# Patient Record
Sex: Male | Born: 1980 | Race: White | Hispanic: No | Marital: Single | Smoking: Former smoker
Health system: Southern US, Community
[De-identification: ages and names within clinical notes are randomized; demographics above are authoritative.]

## PROBLEM LIST (undated history)

## (undated) HISTORY — PX: SHOULDER ARTHROSCOPY W/ LABRAL REPAIR: SHX2399

---
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
## 2008-12-02 ENCOUNTER — Encounter: Admission: RE | Admit: 2008-12-02 | Discharge: 2008-12-02 | Payer: Self-pay | Admitting: Sports Medicine

## 2009-06-01 ENCOUNTER — Encounter: Admission: RE | Admit: 2009-06-01 | Discharge: 2009-06-01 | Payer: Self-pay | Admitting: Gastroenterology

## 2010-08-01 ENCOUNTER — Emergency Department (HOSPITAL_COMMUNITY): Payer: BC Managed Care – PPO

## 2010-08-01 ENCOUNTER — Emergency Department (HOSPITAL_COMMUNITY)
Admission: EM | Admit: 2010-08-01 | Discharge: 2010-08-01 | Disposition: A | Discharge status: Home / Self Care | Payer: BC Managed Care – PPO | Attending: Emergency Medicine | Admitting: Emergency Medicine

## 2010-08-01 DIAGNOSIS — M25539 Pain in unspecified wrist: Secondary | ICD-10-CM | POA: Insufficient documentation

## 2010-08-01 DIAGNOSIS — M25519 Pain in unspecified shoulder: Secondary | ICD-10-CM | POA: Insufficient documentation

## 2011-04-10 ENCOUNTER — Encounter: Payer: Self-pay | Admitting: *Deleted

## 2011-04-10 ENCOUNTER — Emergency Department (HOSPITAL_COMMUNITY)
Admission: EM | Admit: 2011-04-10 | Discharge: 2011-04-10 | Disposition: A | Discharge status: Home / Self Care | Payer: BC Managed Care – PPO | Attending: Emergency Medicine | Admitting: Emergency Medicine

## 2011-04-10 DIAGNOSIS — R109 Unspecified abdominal pain: Secondary | ICD-10-CM | POA: Insufficient documentation

## 2011-04-10 DIAGNOSIS — K2970 Gastritis, unspecified, without bleeding: Secondary | ICD-10-CM

## 2011-04-10 DIAGNOSIS — R112 Nausea with vomiting, unspecified: Secondary | ICD-10-CM | POA: Insufficient documentation

## 2011-04-10 LAB — COMPREHENSIVE METABOLIC PANEL
ALT: 15 U/L (ref 0–53)
Alkaline Phosphatase: 82 U/L (ref 39–117)
CO2: 27 mEq/L (ref 19–32)
Calcium: 10 mg/dL (ref 8.4–10.5)
Total Protein: 7.9 g/dL (ref 6.0–8.3)

## 2011-04-10 LAB — CBC
Hemoglobin: 17.2 g/dL — ABNORMAL HIGH (ref 13.0–17.0)
MCHC: 36.3 g/dL — ABNORMAL HIGH (ref 30.0–36.0)
RBC: 5.43 MIL/uL (ref 4.22–5.81)

## 2011-04-10 LAB — DIFFERENTIAL
Basophils Relative: 0 % (ref 0–1)
Eosinophils Absolute: 0.4 10*3/uL (ref 0.0–0.7)
Eosinophils Relative: 4 % (ref 0–5)
Lymphocytes Relative: 29 % (ref 12–46)
Lymphs Abs: 3.3 10*3/uL (ref 0.7–4.0)
Monocytes Absolute: 1.1 10*3/uL — ABNORMAL HIGH (ref 0.1–1.0)
Monocytes Relative: 9 % (ref 3–12)
Neutro Abs: 6.4 10*3/uL (ref 1.7–7.7)

## 2011-04-10 LAB — URINALYSIS, ROUTINE W REFLEX MICROSCOPIC
Nitrite: NEGATIVE
pH: 6 (ref 5.0–8.0)

## 2011-04-10 LAB — URINE MICROSCOPIC-ADD ON

## 2011-04-10 MED ORDER — ONDANSETRON HCL 4 MG/2ML IJ SOLN
4.0000 mg | Freq: Once | INTRAMUSCULAR | Status: AC
  Administered 2011-04-10: 4 mg via INTRAVENOUS
  Filled 2011-04-10: qty 2

## 2011-04-10 MED ORDER — METOCLOPRAMIDE HCL 10 MG PO TABS: 10.0000 mg | ORAL_TABLET | Freq: Four times a day (QID) | ORAL | Status: AC | PRN

## 2011-04-10 MED ORDER — SODIUM CHLORIDE 0.9 % IV BOLUS (SEPSIS)
1000.0000 mL | Freq: Once | INTRAVENOUS | Status: AC
  Administered 2011-04-10: 1000 mL via INTRAVENOUS

## 2011-04-10 MED ORDER — SODIUM CHLORIDE 0.9 % IV SOLN
Freq: Once | INTRAVENOUS | Status: AC
  Administered 2011-04-10: 1000 mL via INTRAVENOUS

## 2011-04-10 NOTE — ED Notes (Signed)
No pain or nausea reported by patient.  Resting comfortably.

## 2011-04-10 NOTE — ED Provider Notes (Signed)
History     CSN: [id number] Arrival date & time: 04/10/2011 11:26 AM   First MD Initiated Contact with Patient 04/10/11 1414      Chief Complaint  Patient presents with  . Abdominal Pain    (Consider location/radiation/quality/duration/timing/severity/associated sxs/prior treatment) Patient is a 30 y.o. male presenting with abdominal pain. The history is provided by the patient.  Abdominal Pain The primary symptoms of the illness include abdominal pain.  He started getting sick 4 days ago oh with nausea and vomiting. This was associated with some crampy periumbilical pain which was 8/10 at its worst. The vomiting subsided yesterday but he has ongoing nausea and has not been able to drink as much fluids as she would like. He denies any diarrhea. Today, abdominal pain is gone and current pain is 0/10. He denies fever, chills, sweats. He did not have any diarrhea. He went to an urgent care Center today who referred him to the emergency department. He denies any sick contacts. Symptoms are described as severe although they are improving.  History reviewed. No pertinent past medical history.  History reviewed. No pertinent past surgical history.  No family history on file.  History  Substance Use Topics  . Smoking status: Never Smoker   . Smokeless tobacco: Not on file  . Alcohol Use: Yes     ocassionally      Review of Systems  Gastrointestinal: Positive for abdominal pain.  All other systems reviewed and are negative.    Allergies  Review of patient's allergies indicates no known allergies.  Home Medications   Current Outpatient Rx  Name Route Sig Dispense Refill  . HYDROCODONE-ACETAMINOPHEN 5-500 MG PO TABS Oral Take 1 tablet by mouth every 6 (six) hours as needed.       BP 127/68  Pulse 78  Temp(Src) 98.7 F (37.1 C) (Oral)  Resp 18  Wt 177 lb (80.287 kg)  SpO2 97%  Physical Exam  Nursing note and vitals reviewed.  30 year old male who is resting  comfortably and in no acute distress. Vital signs are normal. Oxygen saturation is a satisfactory 97% on room air. Head is normocephalic and atraumatic. Pupils are equal and reactive extraocular movements are full. Mucous members are dry. Neck is supple without adenopathy. Lungs are clear without rales, wheezes, rhonchi. Back is nontender and there is no CVA tenderness. Heart has regular rate rhythm without murmur. No chest wall tenderness. Abdomen is soft, flat, with minimal periumbilical tenderness. There is no rebound or guarding. Peristalsis is diminished. Extremities have no cyanosis or edema, full range of motion is present. Skin is warm and moist without rash. Neurologic: Mental status is normal, cranial nerves are intact, there no focal motor or sensory deficits.  ED Course  Procedures (including critical care time)  Labs Reviewed - No data to display No results found. Results for orders placed during the hospital encounter of 04/10/11  CBC      Component Value Range   WBC 11.2 (*) 4.0 - 10.5 (K/uL)   RBC 5.43  4.22 - 5.81 (MIL/uL)   Hemoglobin 17.2 (*) 13.0 - 17.0 (g/dL)   HCT 74.2  59.5 - 63.8 (%)   MCV 87.3  78.0 - 100.0 (fL)   MCH 31.7  26.0 - 34.0 (pg)   MCHC 36.3 (*) 30.0 - 36.0 (g/dL)   RDW 75.6  43.3 - 29.5 (%)   Platelets 211  150 - 400 (K/uL)  DIFFERENTIAL      Component Value Range   Neutrophils  Relative 57  43 - 77 (%)   Neutro Abs 6.4  1.7 - 7.7 (K/uL)   Lymphocytes Relative 29  12 - 46 (%)   Lymphs Abs 3.3  0.7 - 4.0 (K/uL)   Monocytes Relative 9  3 - 12 (%)   Monocytes Absolute 1.1 (*) 0.1 - 1.0 (K/uL)   Eosinophils Relative 4  0 - 5 (%)   Eosinophils Absolute 0.4  0.0 - 0.7 (K/uL)   Basophils Relative 0  0 - 1 (%)   Basophils Absolute 0.1  0.0 - 0.1 (K/uL)  COMPREHENSIVE METABOLIC PANEL      Component Value Range   Sodium 134 (*) 135 - 145 (mEq/L)   Potassium 3.6  3.5 - 5.1 (mEq/L)   Chloride 95 (*) 96 - 112 (mEq/L)   CO2 27  19 - 32 (mEq/L)   Glucose, Bld  99  70 - 99 (mg/dL)   BUN 19  6 - 23 (mg/dL)   Creatinine, Ser 0.98  0.50 - 1.35 (mg/dL)   Calcium 11.9  8.4 - 10.5 (mg/dL)   Total Protein 7.9  6.0 - 8.3 (g/dL)   Albumin 4.8  3.5 - 5.2 (g/dL)   AST 16  0 - 37 (U/L)   ALT 15  0 - 53 (U/L)   Alkaline Phosphatase 82  39 - 117 (U/L)   Total Bilirubin 1.0  0.3 - 1.2 (mg/dL)   GFR calc non Af Amer 80 (*) >90 (mL/min)   GFR calc Af Amer >90  >90 (mL/min)  LIPASE, BLOOD      Component Value Range   Lipase 61 (*) 11 - 59 (U/L)  URINALYSIS, ROUTINE W REFLEX MICROSCOPIC      Component Value Range   Color, Urine AMBER (*) YELLOW    APPearance CLEAR  CLEAR    Specific Gravity, Urine 1.030  1.005 - 1.030    pH 6.0  5.0 - 8.0    Glucose, UA NEGATIVE  NEGATIVE (mg/dL)   Hgb urine dipstick NEGATIVE  NEGATIVE    Bilirubin Urine SMALL (*) NEGATIVE    Ketones, ur TRACE (*) NEGATIVE (mg/dL)   Protein, ur NEGATIVE  NEGATIVE (mg/dL)   Urobilinogen, UA 1.0  0.0 - 1.0 (mg/dL)   Nitrite NEGATIVE  NEGATIVE    Leukocytes, UA SMALL (*) NEGATIVE   URINE MICROSCOPIC-ADD ON      Component Value Range   Squamous Epithelial / LPF FEW (*) RARE    WBC, UA 7-10  <3 (WBC/hpf)   Bacteria, UA FEW (*) RARE    No results found.    No diagnosis found.  He is feeling much better after IV hydration and IV Zofran. Slight elevation of lipase probably not significant. I've talked to the patient and he is adamant that he has not done any significant drinking in the last 2 years.  MDM  Probable episode of viral gastritis.        Dione Booze, MD 04/10/11 [phone]

## 2011-04-10 NOTE — ED Notes (Signed)
Report received from Mildred RN. 
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
## 2012-10-01 ENCOUNTER — Ambulatory Visit: Payer: Self-pay | Admitting: Family Medicine

## 2012-10-01 VITALS — BP 130/84 | HR 70 | Temp 98.5°F | Resp 16 | Ht 70.5 in | Wt 187.0 lb

## 2012-10-01 DIAGNOSIS — Z0289 Encounter for other administrative examinations: Secondary | ICD-10-CM

## 2012-10-01 DIAGNOSIS — Z0000 Encounter for general adult medical examination without abnormal findings: Secondary | ICD-10-CM

## 2012-10-01 NOTE — Progress Notes (Signed)
[facility] [address] [place] [address] [phone]  Date:  10/01/2012   Name:  Julian Jensen   DOB:  1980-06-07   MRN:  [id number]  PCP:  No primary provider on file.    Chief Complaint: Employment Physical   History of Present Illness:  Julian Jensen is a 32 y.o. very pleasant male patient who presents with the following:  Here for a self- pay DOT.  He is generally healthy. He does not have any significant past medical history that he is aware of.  He has been told that he had hematuria in the past- this has been the case for a long time, but he has not had any further evaluation so far.   There are no active problems to display for this patient.   History reviewed. No pertinent past medical history.  History reviewed. No pertinent past surgical history.  History  Substance Use Topics  . Smoking status: Never Smoker   . Smokeless tobacco: Not on file  . Alcohol Use: Yes     Comment: ocassionally    History reviewed. No pertinent family history.  No Known Allergies  Medication list has been reviewed and updated.  Current Outpatient Prescriptions on File Prior to Visit  Medication Sig Dispense Refill  . HYDROcodone-acetaminophen (VICODIN) 5-500 MG per tablet Take 1 tablet by mouth every 6 (six) hours as needed.        No current facility-administered medications on file prior to visit.    Review of Systems:  As per HPI- otherwise negative.   Physical Examination: Filed Vitals:   10/01/12 1442  BP: 130/84  Pulse: 70  Temp: 98.5 F (36.9 C)  Resp: 16   Filed Vitals:   10/01/12 1442  Height: 5' 10.5" (1.791 m)  Weight: 187 lb (84.823 kg)   Body mass index is 26.44 kg/(m^2). Ideal Body Weight: Weight in (lb) to have BMI = 25: 176.4  GEN: WDWN, NAD, Non-toxic, A & O x 3 HEENT: Atraumatic, Normocephalic. Neck supple. No masses, No LAD.  Bilateral TM wnl, oropharynx normal.  PEERL,EOMI.   Ears and Nose: No external  deformity. CV: RRR, No M/G/R. No JVD. No thrill. No extra heart sounds. PULM: CTA B, no wheezes, crackles, rhonchi. No retractions. No resp. distress. No accessory muscle use. ABD: S, NT, ND, +BS. No rebound. No HSM. EXTR: No c/c/e.  Normal strength, sensation and DTR NEURO Normal gait.  PSYCH: Normally interactive. Conversant. Not depressed or anxious appearing.  Calm demeanor.  No inguinal hernia   Assessment and Plan: Physical exam  DOT PE- normal, 2 year card given.   Advised him to have his persistent microhematuria evaluated- he will plan to call [facility] and schedule an appt. If he needs a referral we are glad to help him with this.   Signed Abbe Amsterdam, MD

## 2012-11-08 ENCOUNTER — Encounter: Payer: Self-pay | Admitting: Family Medicine

## 2013-01-21 ENCOUNTER — Ambulatory Visit: Payer: PRIVATE HEALTH INSURANCE | Admitting: Family Medicine

## 2013-01-21 VITALS — BP 132/80 | HR 76 | Temp 98.1°F | Resp 16 | Ht 70.0 in | Wt 182.6 lb

## 2013-01-21 DIAGNOSIS — Z113 Encounter for screening for infections with a predominantly sexual mode of transmission: Secondary | ICD-10-CM

## 2013-01-21 DIAGNOSIS — Z202 Contact with and (suspected) exposure to infections with a predominantly sexual mode of transmission: Secondary | ICD-10-CM

## 2013-01-21 MED ORDER — AZITHROMYCIN 250 MG PO TABS: ORAL_TABLET | ORAL | Status: DC

## 2013-01-21 NOTE — Progress Notes (Signed)
  [facility]:  Office Visit  Chief Complaint:  Chief Complaint  Patient presents with  . Exposure to STD    HPI: Julian Jensen is a 32 y.o. male who is here for check of STD, GF of 8 months has chlamydia.  She has been treated and he has not had sex with her since then.  He denies any sxs or any prior STDs  History reviewed. No pertinent past medical history. History reviewed. No pertinent past surgical history. History   Social History  . Marital Status: Single    Spouse Name: N/A    Number of Children: N/A  . Years of Education: N/A   Social History Main Topics  . Smoking status: Never Smoker   . Smokeless tobacco: None  . Alcohol Use: Yes     Comment: ocassionally  . Drug Use: No  . Sexual Activity: Yes   Other Topics Concern  . None   Social History Narrative  . None   History reviewed. No pertinent family history. No Known Allergies Prior to Admission medications   Medication Sig Start Date End Date Taking? Authorizing Provider  HYDROcodone-acetaminophen (VICODIN) 5-500 MG per tablet Take 1 tablet by mouth every 6 (six) hours as needed.     Historical Provider, MD     ROS: The patient denies fevers, chills, night sweats, unintentional weight loss, chest pain, palpitations, wheezing, dyspnea on exertion, nausea, vomiting, abdominal pain, dysuria, hematuria, melena, numbness, weakness, or tingling.   All other systems have been reviewed and were otherwise negative with the exception of those mentioned in the HPI and as above.    PHYSICAL EXAM: Filed Vitals:   01/21/13 1437  BP: 132/80  Pulse: 76  Temp: 98.1 F (36.7 C)  Resp: 16   Filed Vitals:   01/21/13 1437  Height: 5\' 10"  (1.778 m)  Weight: 182 lb 9.6 oz (82.827 kg)   Body mass index is 26.2 kg/(m^2).  General: Alert, no acute distress HEENT:  Normocephalic, atraumatic, oropharynx patent. EOMI, PERRLA Cardiovascular:  Regular rate and rhythm, no rubs murmurs or gallops.   No Carotid bruits, radial pulse intact. No pedal edema.  Respiratory: Clear to auscultation bilaterally.  No wheezes, rales, or rhonchi.  No cyanosis, no use of accessory musculature GI: No organomegaly, abdomen is soft and non-tender, positive bowel sounds.  No masses. Skin: No rashes. Neurologic: Facial musculature symmetric. Psychiatric: Patient is appropriate throughout our interaction. Lymphatic: No cervical lymphadenopathy Musculoskeletal: Gait intact.   LABS:    EKG/XRAY:   Primary read interpreted by Dr. Conley Rolls at [facility].   ASSESSMENT/PLAN: Encounter Diagnoses  Name Primary?  . Possible exposure to STD Yes  . Screening for STD (sexually transmitted disease)    Rx Azithromycin 250 mg x 4 pills Labs pending: HIV, RPR, G/C He declined having other tests done Gross sideeffects, risk and benefits, and alternatives of medications d/w patient. Patient is aware that all medications have potential sideeffects and we are unable to predict every sideeffect or drug-drug interaction that may occur.  Hamilton Capri PHUONG, DO 01/21/2013 3:40 PM

## 2013-01-22 LAB — SYPHILIS: RPR W/REFLEX TO RPR TITER AND TREPONEMAL ANTIBODIES, TRADITIONAL SCREENING AND DIAGNOSIS ALGORITHM

## 2013-01-22 LAB — GC/CHLAMYDIA PROBE AMP, URINE
Chlamydia, Swab/Urine, PCR: POSITIVE — AB
GC Probe Amp, Urine: NEGATIVE

## 2013-01-22 LAB — HIV ANTIBODY (ROUTINE TESTING W REFLEX): HIV: NONREACTIVE

## 2013-02-26 ENCOUNTER — Other Ambulatory Visit: Payer: Self-pay

## 2013-03-23 ENCOUNTER — Ambulatory Visit (INDEPENDENT_AMBULATORY_CARE_PROVIDER_SITE_OTHER): Payer: PRIVATE HEALTH INSURANCE | Admitting: Family Medicine

## 2013-03-23 ENCOUNTER — Ambulatory Visit: Payer: PRIVATE HEALTH INSURANCE

## 2013-03-23 VITALS — BP 102/66 | HR 85 | Temp 98.0°F | Resp 16 | Ht 70.0 in | Wt 187.0 lb

## 2013-03-23 DIAGNOSIS — S43004A Unspecified dislocation of right shoulder joint, initial encounter: Secondary | ICD-10-CM

## 2013-03-23 DIAGNOSIS — S43006A Unspecified dislocation of unspecified shoulder joint, initial encounter: Secondary | ICD-10-CM

## 2013-03-23 MED ORDER — HYDROCODONE-ACETAMINOPHEN 5-500 MG PO TABS: 1.0000 | ORAL_TABLET | Freq: Four times a day (QID) | ORAL | Status: DC | PRN

## 2013-03-23 NOTE — Patient Instructions (Signed)
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
## 2015-03-15 IMAGING — CR DG SHOULDER 2+V*R*
3 series · 3 of 3 positions shown · non-contrast
Comparison: MR arthrogram dated 12/02/2008.

CLINICAL DATA: Right shoulder pain and limited range of motion and
swelling. History of prior dislocations.

EXAM:
RIGHT SHOULDER - 2+ VIEW

[AP (1 of 2)]
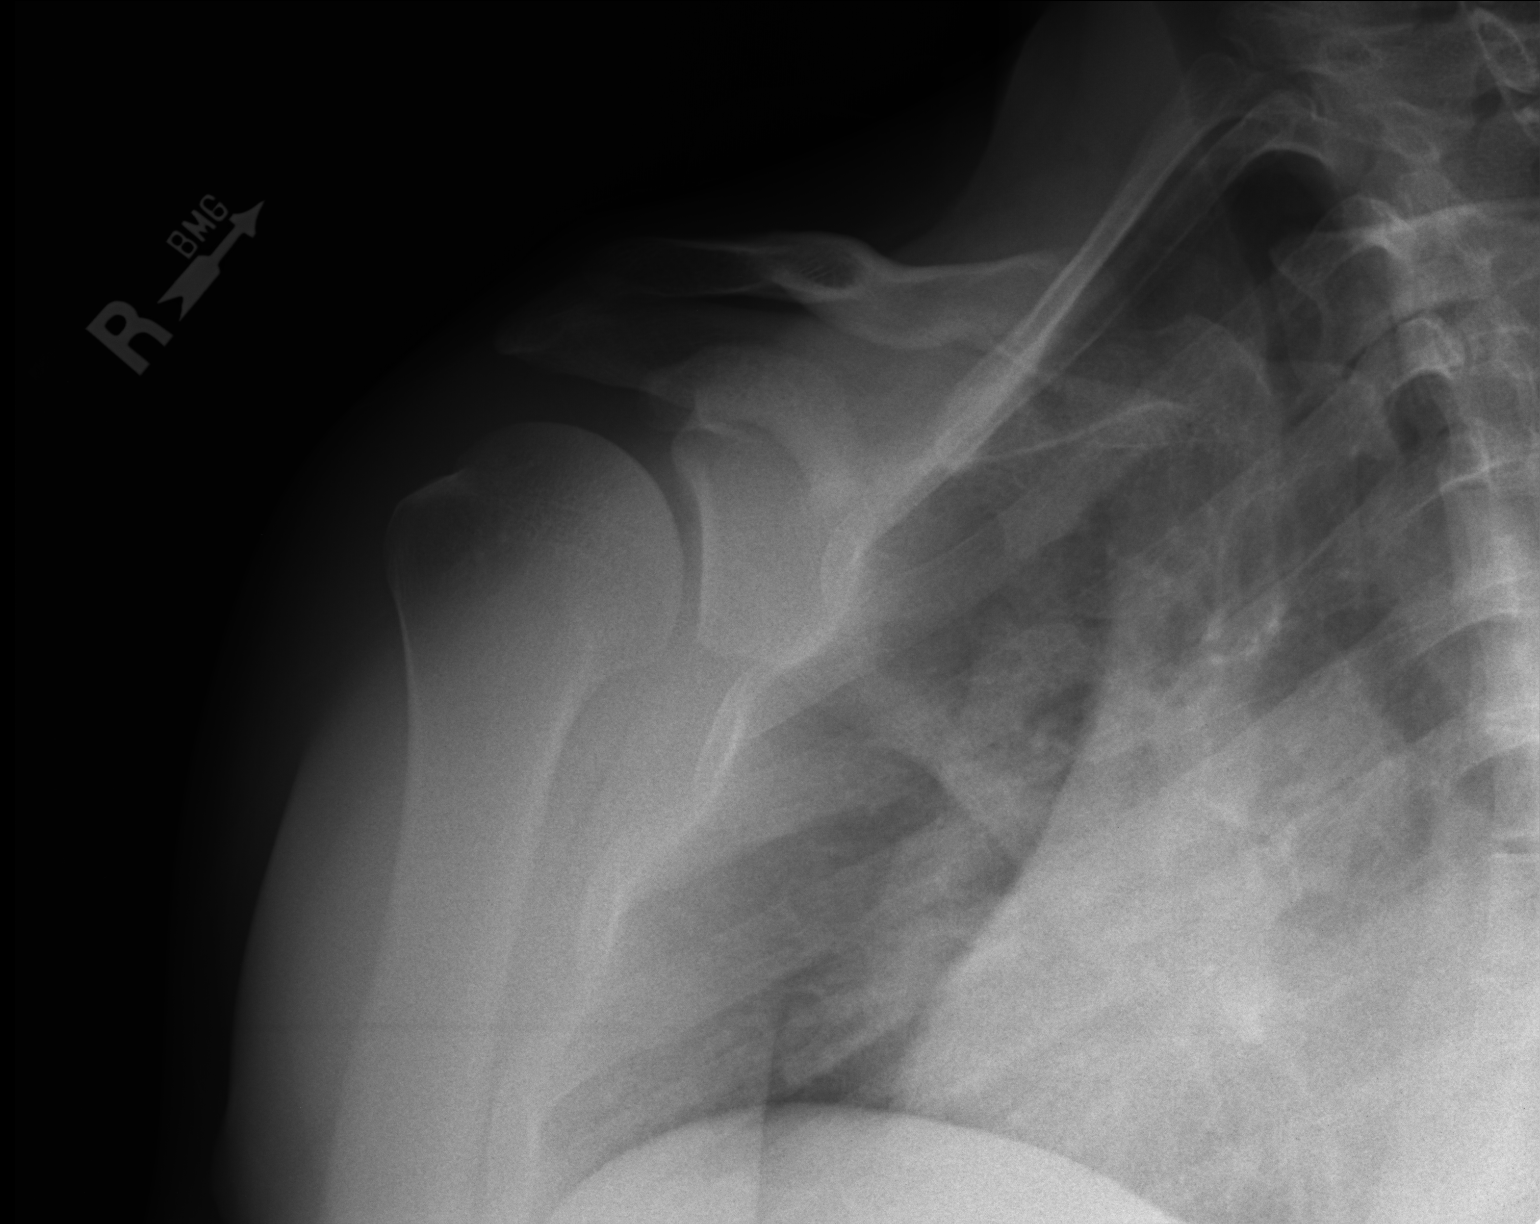

[AP (2 of 2)]
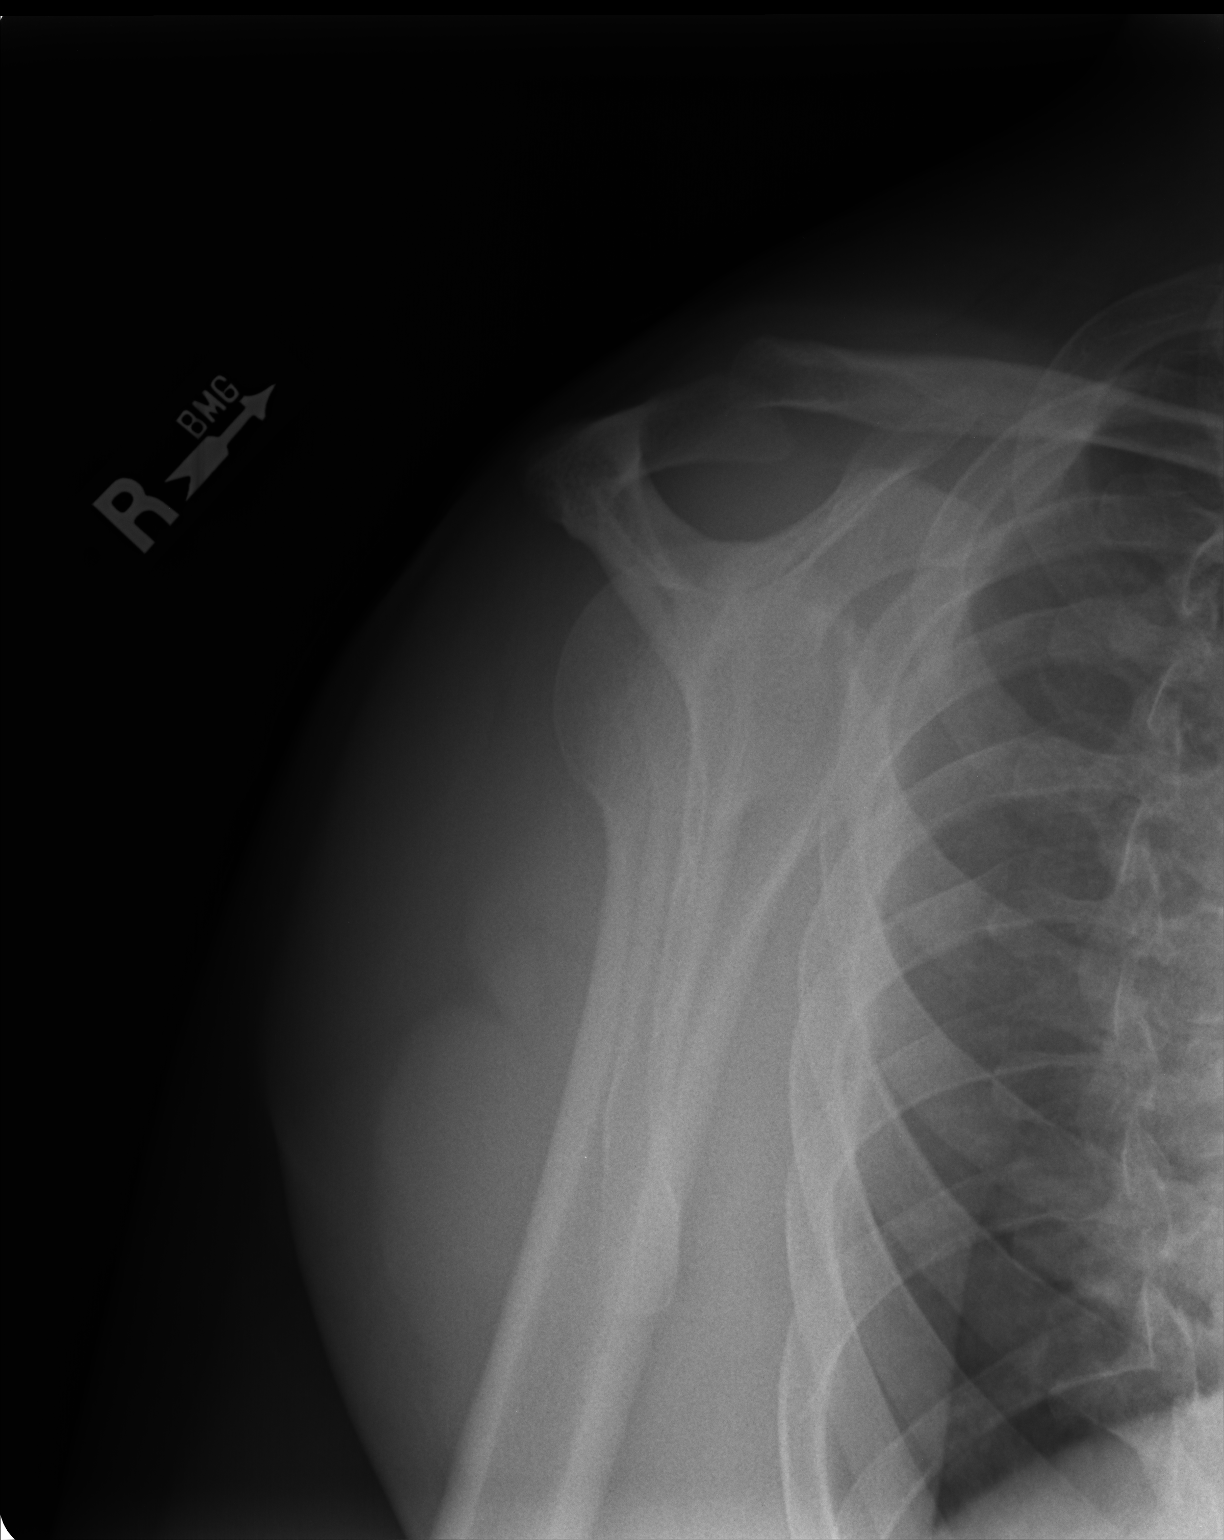

[axial]
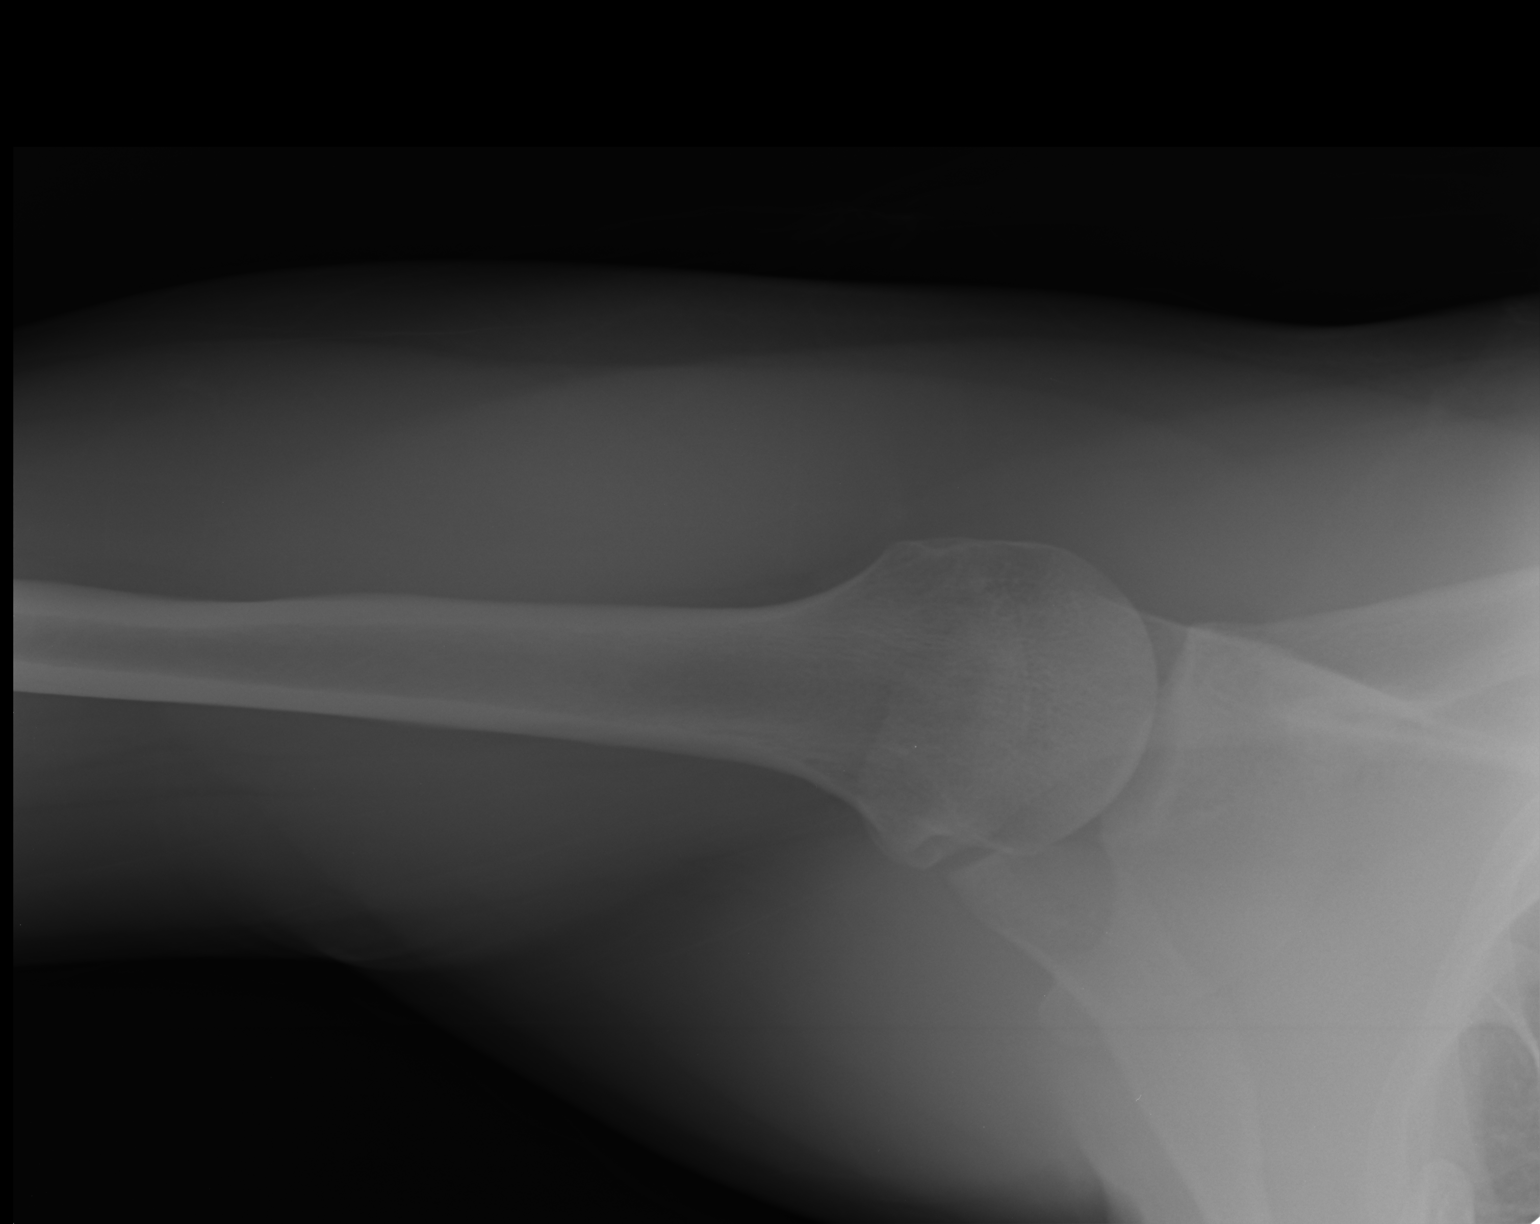

[3 of 3 positions shown; findings below may reference images not displayed]

FINDINGS: There is no acute fracture or dislocation. No soft tissue
calcifications. Acromioclavicular joint is normal.
IMPRESSION: No acute abnormalities.

## 2015-04-19 ENCOUNTER — Emergency Department (HOSPITAL_BASED_OUTPATIENT_CLINIC_OR_DEPARTMENT_OTHER): Payer: BLUE CROSS/BLUE SHIELD

## 2015-04-19 ENCOUNTER — Encounter (HOSPITAL_BASED_OUTPATIENT_CLINIC_OR_DEPARTMENT_OTHER): Payer: Self-pay | Admitting: *Deleted

## 2015-04-19 ENCOUNTER — Emergency Department (HOSPITAL_BASED_OUTPATIENT_CLINIC_OR_DEPARTMENT_OTHER)
Admission: EM | Admit: 2015-04-19 | Discharge: 2015-04-19 | Disposition: A | Discharge status: Home / Self Care | Payer: BLUE CROSS/BLUE SHIELD | Attending: Emergency Medicine | Admitting: Emergency Medicine

## 2015-04-19 DIAGNOSIS — S43014A Anterior dislocation of right humerus, initial encounter: Secondary | ICD-10-CM | POA: Insufficient documentation

## 2015-04-19 DIAGNOSIS — F17210 Nicotine dependence, cigarettes, uncomplicated: Secondary | ICD-10-CM | POA: Diagnosis not present

## 2015-04-19 DIAGNOSIS — S4991XA Unspecified injury of right shoulder and upper arm, initial encounter: Secondary | ICD-10-CM | POA: Diagnosis present

## 2015-04-19 DIAGNOSIS — Y998 Other external cause status: Secondary | ICD-10-CM | POA: Insufficient documentation

## 2015-04-19 DIAGNOSIS — Y9289 Other specified places as the place of occurrence of the external cause: Secondary | ICD-10-CM | POA: Diagnosis not present

## 2015-04-19 DIAGNOSIS — S43004A Unspecified dislocation of right shoulder joint, initial encounter: Secondary | ICD-10-CM

## 2015-04-19 DIAGNOSIS — Y9389 Activity, other specified: Secondary | ICD-10-CM | POA: Insufficient documentation

## 2015-04-19 DIAGNOSIS — X501XXA Overexertion from prolonged static or awkward postures, initial encounter: Secondary | ICD-10-CM | POA: Diagnosis not present

## 2015-04-19 MED ORDER — HYDROMORPHONE HCL 1 MG/ML IJ SOLN
INTRAMUSCULAR | Status: AC
  Administered 2015-04-19: 1 mg via INTRAVENOUS
  Filled 2015-04-19: qty 1

## 2015-04-19 MED ORDER — KETOROLAC TROMETHAMINE 15 MG/ML IJ SOLN
INTRAMUSCULAR | Status: AC
  Administered 2015-04-19: 15 mg via INTRAVENOUS
  Filled 2015-04-19: qty 1

## 2015-04-19 MED ORDER — KETOROLAC TROMETHAMINE 15 MG/ML IJ SOLN
15.0000 mg | Freq: Once | INTRAMUSCULAR | Status: AC
  Administered 2015-04-19: 15 mg via INTRAVENOUS

## 2015-04-19 MED ORDER — ETOMIDATE 2 MG/ML IV SOLN
10.0000 mg | Freq: Once | INTRAVENOUS | Status: AC
  Administered 2015-04-19: 10 mg via INTRAVENOUS
  Filled 2015-04-19: qty 10

## 2015-04-19 MED ORDER — ETOMIDATE 2 MG/ML IV SOLN
INTRAVENOUS | Status: AC | PRN
Start: 2015-04-19 — End: 2015-04-19
  Administered 2015-04-19: 6 mg via INTRAVENOUS

## 2015-04-19 MED ORDER — DIAZEPAM 5 MG/ML IJ SOLN
5.0000 mg | Freq: Once | INTRAMUSCULAR | Status: AC
  Administered 2015-04-19: 5 mg via INTRAVENOUS

## 2015-04-19 MED ORDER — LIDOCAINE HCL (PF) 1 % IJ SOLN
INTRAMUSCULAR | Status: AC
  Filled 2015-04-19: qty 10

## 2015-04-19 MED ORDER — TRAMADOL HCL 50 MG PO TABS: 50.0000 mg | ORAL_TABLET | Freq: Four times a day (QID) | ORAL | Status: AC | PRN

## 2015-04-19 MED ORDER — DIAZEPAM 5 MG/ML IJ SOLN
INTRAMUSCULAR | Status: AC
  Administered 2015-04-19: 5 mg via INTRAVENOUS
  Filled 2015-04-19: qty 2

## 2015-04-19 MED ORDER — SODIUM CHLORIDE 0.9 % IV SOLN
INTRAVENOUS | Status: DC
  Administered 2015-04-19: 15:00:00 via INTRAVENOUS

## 2015-04-19 MED ORDER — HYDROMORPHONE HCL 1 MG/ML IJ SOLN
1.0000 mg | Freq: Once | INTRAMUSCULAR | Status: AC
Start: 2015-04-19 — End: 2015-04-19
  Administered 2015-04-19: 1 mg via INTRAVENOUS

## 2015-04-19 NOTE — ED Notes (Signed)
MD at bedside attempting to reduce right shoulder.

## 2015-04-19 NOTE — ED Notes (Signed)
Consent signed for sedation for right shoulder reduction.

## 2015-04-19 NOTE — ED Notes (Signed)
Right shoulder appears to be reduced at this time.

## 2015-04-19 NOTE — Discharge Instructions (Signed)
Shoulder Dislocation °A shoulder dislocation happens when the upper arm bone (humerus) moves out of the shoulder joint. The shoulder joint is the part of the shoulder where the humerus, shoulder blade (scapula), and collarbone (clavicle) meet. °CAUSES °This condition is often caused by: °· A fall. °· A hit to the shoulder. °· A forceful movement of the shoulder. °RISK FACTORS °This condition is more likely to develop in people who play sports. °SYMPTOMS °Symptoms of this condition include: °· Deformity of the shoulder. °· Intense pain. °· Inability to move the shoulder. °· Numbness, weakness, or tingling in your neck or down your arm. °· Bruising or swelling around your shoulder. °DIAGNOSIS °This condition is diagnosed with a physical exam. After the exam, tests may be done to check for related problems. Tests that may be done include: °· X-ray. This may be done to check for broken bones. °· MRI. This may be done to check for damage to the tissues around the shoulder. °· Electromyogram. This may be done to check for nerve damage. °TREATMENT °This condition is treated with a procedure to place the humerus back in the joint. This procedure is called a reduction. There are two types of reduction: °· Closed reduction. In this procedure, the humerus is placed back in the joint without surgery. The health care provider uses his or her hands to guide the bone back into place. °· Open reduction. In this procedure, the humerus is placed back in the joint with surgery. An open reduction may be recommended if: °¨ You have a weak shoulder joint or weak ligaments. °¨ You have had more than one shoulder dislocation. °¨ The nerves or blood vessels around your shoulder have been damaged. °After the humerus is placed back into the joint, your arm will be placed in a splint or sling to prevent it from moving. You will need to wear the splint or sling until your shoulder heals. When the splint or sling is removed, you may have  physical therapy to help improve the range of motion in your shoulder joint. °HOME CARE INSTRUCTIONS °If You Have a Splint or Sling: °· Wear it as told by your health care provider. Remove it only as told by your health care provider. °· Loosen it if your fingers become numb and tingle, or if they turn cold and blue. °· Keep it clean and dry. °Bathing °· Do not take baths, swim, or use a hot tub until your health care provider approves. Ask your health care provider if you can take showers. You may only be allowed to take sponge baths for bathing. °· If your health care provider approves bathing and showering, cover your splint or sling with a watertight plastic bag to protect it from water. Do not let the splint or sling get wet. °Managing Pain, Stiffness, and Swelling °· If directed, apply ice to the injured area. °¨ Put ice in a plastic bag. °¨ Place a towel between your skin and the bag. °¨ Leave the ice on for 20 minutes, 2-3 times per day. °· Move your fingers often to avoid stiffness and to decrease swelling. °· Raise (elevate) the injured area above the level of your heart while you are sitting or lying down. °Driving °· Do not drive while wearing a splint or sling on a hand that you use for driving. °· Do not drive or operate heavy machinery while taking pain medicine. °Activity °· Return to your normal activities as told by your health care provider. Ask your   health care provider what activities are safe for you. °· Perform range-of-motion exercises only as told by your health care provider. °· Exercise your hand by squeezing a soft ball. This helps to decrease stiffness and swelling in your hand and wrist. °General Instructions °· Take over-the-counter and prescription medicines only as told by your health care provider. °· Do not use any tobacco products, including cigarettes, chewing tobacco, or e-cigarettes. Tobacco can delay bone and tissue healing. If you need help quitting, ask your health care  provider. °· Keep all follow-up visits as told by your health care provider. This is important. °SEEK MEDICAL CARE IF: °· Your splint or sling gets damaged. °SEEK IMMEDIATE MEDICAL CARE IF: °· Your pain gets worse rather than better. °· You lose feeling in your arm or hand. °· Your arm or hand becomes white and cold. °  °This information is not intended to replace advice given to you by your health care provider. Make sure you discuss any questions you have with your health care provider. °  °Document Released: 01/02/2001 Document Revised: 12/29/2014 Document Reviewed: 08/02/2014 °Elsevier Interactive Patient Education ©2016 [employer] ° °

## 2015-04-19 NOTE — ED Notes (Signed)
Pt c/o right shoulder  injury x 30 mins ago

## 2015-04-19 NOTE — ED Provider Notes (Signed)
CSN: [id number]     Arrival date & time 04/19/15  1424 History   First MD Initiated Contact with Patient 04/19/15 1452     Chief Complaint  Patient presents with  . Shoulder Injury     (Consider location/radiation/quality/duration/timing/severity/associated sxs/prior Treatment) HPI   34 year old male with right shoulder pain. Patient has a past history recurrent right shoulder dislocations. He has previously had surgery on the shoulder for the same. Just prior to arrival he was lifting up some sheet metal above his head when he felt his shoulder move and had acute onset of pain. It has been persistent since then. Symptoms were consistent with prior dislocations. No numbness or tingling. No other acute complaints. No intervention prior to arrival.  History reviewed. No pertinent past medical history. History reviewed. No pertinent past surgical history. History reviewed. No pertinent family history. Social History  Substance Use Topics  . Smoking status: Current Every Day Smoker -- 0.50 packs/day    Types: Cigarettes  . Smokeless tobacco: None  . Alcohol Use: Yes     Comment: ocassionally    Review of Systems  All systems reviewed and negative, other than as noted in HPI.   Allergies  Review of patient's allergies indicates no known allergies.  Home Medications   Prior to Admission medications   Not on File   BP 133/47 mmHg  Pulse 72  Temp(Src) 97.8 F (36.6 C) (Oral)  Resp 17  Ht 6' (1.829 m)  Wt 185 lb (83.915 kg)  BMI 25.08 kg/m2  SpO2 98% Physical Exam  Constitutional: He appears well-developed and well-nourished. No distress.  HENT:  Head: Normocephalic and atraumatic.  Eyes: Conjunctivae are normal. Right eye exhibits no discharge. Left eye exhibits no discharge.  Neck: Neck supple.  Cardiovascular: Normal rate, regular rhythm and normal heart sounds.  Exam reveals no gallop and no friction rub.   No murmur heard. Pulmonary/Chest: Effort normal and breath  sounds normal. No respiratory distress.  Abdominal: Soft. He exhibits no distension. There is no tenderness.  Musculoskeletal: He exhibits no edema or tenderness.  "squared off" appearance to right shoulder consistent with anterior dislocation. Significant pain with any attempted range of motion. Neurovascular intact.  Neurological: He is alert.  Skin: Skin is warm and dry.  Psychiatric: He has a normal mood and affect. His behavior is normal. Thought content normal.  Nursing note and vitals reviewed.   ED Course  Reduction of dislocation Date/Time: 05/20/2015 4:00 PM Performed by: Raeford Razor Authorized by: Raeford Razor Consent: Verbal consent obtained. Written consent obtained. Risks and benefits: risks, benefits and alternatives were discussed Consent given by: patient Required items: required blood products, implants, devices, and special equipment available Patient identity confirmed: verbally with patient, provided demographic data and arm band Time out: Immediately prior to procedure a "time out" was called to verify the correct patient, procedure, equipment, support staff and site/side marked as required. Local anesthesia used: yes Anesthesia: local infiltration Local anesthetic: lidocaine 1% without epinephrine Anesthetic total: 10 ml Patient sedated: yes Sedation type: moderate (conscious) sedation Sedatives: etomidate Vitals: Vital signs were monitored during sedation. Patient tolerance: Patient tolerated the procedure well with no immediate complications    Procedural Sedation  Preprocedure  Pre-anesthesia/induction confirmation of laterality/correct procedure site including "time-out."  Provider confirms review of the nurses' note, allergies, medications, pertinent labs, PMH, pre-induction vital signs, pulse oximetry, pain level, and ECG (as applicable), and patient condition satisfactory for commencing with order for sedation and procedure.  Medications:  etomidate  Patient tolerated procedure and procedural sedation component as expected without apparent immediate complications.  Physician confirms procedural medication orders as administered, patient was assessed by physician post-procedure, and confirms post-sedation plan of care and disposition.  Total time of sedation/monitoring: 30 minutes   (including critical care time) Labs Review Labs Reviewed - No data to display  Imaging Review No results found.   Dg Shoulder Right  04/19/2015  CLINICAL DATA:  Right shoulder injury picking up a piece of metal. Pain radiates to the elbow. Initial encounter. EXAM: RIGHT SHOULDER - 2+ VIEW COMPARISON:  03/23/2013 FINDINGS: There is anterior dislocation of the humeral head relative to the glenoid. A Hill-Sachs deformity is noted, present on an MRI from 2010. No definite acute fracture is identified. No lytic or blastic osseous lesion is seen. IMPRESSION: Anterior glenohumeral dislocation. Electronically Signed   By: Sebastian AcheAllen  Grady M.D.   On: 04/19/2015 14:54   Dg Shoulder Right Port  04/19/2015  CLINICAL DATA:  Status post reduction of dislocation. EXAM: PORTABLE RIGHT SHOULDER - 2+ VIEW COMPARISON:  Same day. FINDINGS: Previously described anterior dislocation has been successfully reduced. No fracture is noted. IMPRESSION: Successful reduction of previously described right anterior dislocation. Electronically Signed   By: Lupita RaiderJames  Green Jr, M.D.   On: 04/19/2015 16:38   I have personally reviewed and evaluated these images and lab results as part of my medical decision-making.   EKG Interpretation None      MDM   Final diagnoses:  Closed dislocation of right shoulder, initial encounter    34yM with closed anterior R shoulder dislocation. Sedated and reduced. Has hx of same. Sling. Ortho FU.     Raeford RazorStephen Janita Camberos, MD 04/29/15 1220

## 2015-06-20 ENCOUNTER — Encounter (HOSPITAL_BASED_OUTPATIENT_CLINIC_OR_DEPARTMENT_OTHER): Payer: Self-pay | Admitting: *Deleted

## 2015-06-20 ENCOUNTER — Encounter (HOSPITAL_COMMUNITY): Payer: Self-pay | Admitting: Emergency Medicine

## 2015-06-20 ENCOUNTER — Emergency Department (HOSPITAL_COMMUNITY)
Admission: EM | Admit: 2015-06-20 | Discharge: 2015-06-20 | Disposition: A | Discharge status: Home / Self Care | Payer: BLUE CROSS/BLUE SHIELD | Source: Home / Self Care

## 2015-06-20 ENCOUNTER — Emergency Department (HOSPITAL_BASED_OUTPATIENT_CLINIC_OR_DEPARTMENT_OTHER)
Admission: EM | Admit: 2015-06-20 | Discharge: 2015-06-21 | Disposition: A | Discharge status: Home / Self Care | Payer: BLUE CROSS/BLUE SHIELD | Attending: Emergency Medicine | Admitting: Emergency Medicine

## 2015-06-20 ENCOUNTER — Emergency Department (HOSPITAL_BASED_OUTPATIENT_CLINIC_OR_DEPARTMENT_OTHER): Payer: BLUE CROSS/BLUE SHIELD

## 2015-06-20 DIAGNOSIS — R61 Generalized hyperhidrosis: Secondary | ICD-10-CM | POA: Insufficient documentation

## 2015-06-20 DIAGNOSIS — K5900 Constipation, unspecified: Secondary | ICD-10-CM | POA: Insufficient documentation

## 2015-06-20 DIAGNOSIS — R1110 Vomiting, unspecified: Secondary | ICD-10-CM | POA: Diagnosis present

## 2015-06-20 DIAGNOSIS — G8918 Other acute postprocedural pain: Secondary | ICD-10-CM | POA: Insufficient documentation

## 2015-06-20 DIAGNOSIS — R112 Nausea with vomiting, unspecified: Secondary | ICD-10-CM | POA: Diagnosis not present

## 2015-06-20 DIAGNOSIS — Z87891 Personal history of nicotine dependence: Secondary | ICD-10-CM | POA: Diagnosis not present

## 2015-06-20 LAB — CBC
HEMATOCRIT: 47.3 % (ref 39.0–52.0)
HEMOGLOBIN: 17.5 g/dL — AB (ref 13.0–17.0)
MCH: 32.3 pg (ref 26.0–34.0)
MCHC: 37 g/dL — ABNORMAL HIGH (ref 30.0–36.0)
MCV: 87.3 fL (ref 78.0–100.0)
Platelets: 230 10*3/uL (ref 150–400)
RBC: 5.42 MIL/uL (ref 4.22–5.81)
RDW: 12.3 % (ref 11.5–15.5)
WBC: 12.7 10*3/uL — AB (ref 4.0–10.5)

## 2015-06-20 LAB — COMPREHENSIVE METABOLIC PANEL
ALBUMIN: 5.4 g/dL — AB (ref 3.5–5.0)
ALT: 22 U/L (ref 17–63)
ANION GAP: 17 — AB (ref 5–15)
AST: 22 U/L (ref 15–41)
Alkaline Phosphatase: 77 U/L (ref 38–126)
BILIRUBIN TOTAL: 1 mg/dL (ref 0.3–1.2)
BUN: 17 mg/dL (ref 6–20)
CO2: 21 mmol/L — ABNORMAL LOW (ref 22–32)
Calcium: 9.7 mg/dL (ref 8.9–10.3)
Chloride: 99 mmol/L — ABNORMAL LOW (ref 101–111)
Creatinine, Ser: 1.32 mg/dL — ABNORMAL HIGH (ref 0.61–1.24)
GFR calc non Af Amer: >60 mL/min (ref >60)
GLUCOSE: 147 mg/dL — AB (ref 65–99)
POTASSIUM: 3.1 mmol/L — AB (ref 3.5–5.1)
Sodium: 137 mmol/L (ref 135–145)
TOTAL PROTEIN: 8.3 g/dL — AB (ref 6.5–8.1)

## 2015-06-20 LAB — LIPASE, BLOOD: Lipase: 21 U/L (ref 11–51)

## 2015-06-20 MED ORDER — FLEET ENEMA 7-19 GM/118ML RE ENEM
1.0000 | ENEMA | Freq: Once | RECTAL | Status: AC
  Administered 2015-06-20: 1 via RECTAL
  Filled 2015-06-20: qty 1

## 2015-06-20 MED ORDER — MORPHINE SULFATE (PF) 4 MG/ML IV SOLN
4.0000 mg | Freq: Once | INTRAVENOUS | Status: AC
  Administered 2015-06-20: 4 mg via INTRAVENOUS
  Filled 2015-06-20: qty 1

## 2015-06-20 MED ORDER — ONDANSETRON HCL 4 MG/2ML IJ SOLN
4.0000 mg | Freq: Once | INTRAMUSCULAR | Status: AC | PRN
  Administered 2015-06-20: 4 mg via INTRAVENOUS
  Filled 2015-06-20: qty 2

## 2015-06-20 NOTE — ED Notes (Signed)
Pt presents c/o constipation and abdominal pain 9/10 x 10 days ago when he had shoulder surgery.  Doctors have prescribed numerous meds for constipation with no relief.  LBM 06/09/15 per pt report.

## 2015-06-20 NOTE — ED Notes (Signed)
Constipated for 11 days. Vomiting. He left [facility] without being seen due to long wait.

## 2015-06-20 NOTE — ED Provider Notes (Signed)
CSN: [id number]     Arrival date & time 06/20/15  2104 History   First MD Initiated Contact with Patient 06/20/15 2210     Chief Complaint  Patient presents with  . Constipation  . Emesis   Patient is a 35 y.o. male presenting with abdominal pain.  Abdominal Pain Pain location:  Generalized Pain quality: cramping   Pain radiates to:  Does not radiate Pain severity:  Severe Onset quality:  Gradual Duration:  1 day Timing:  Constant Progression:  Worsening Context: previous surgery   Context comment:  Narcotic use Relieved by:  Nothing Worsened by:  Movement Ineffective treatments: Laxatives and stool softeners. Associated symptoms: constipation, nausea and vomiting     Julian Jensen is a 35 year old male presenting with constipation and vomiting. Patient reports last bowel movement 11 days ago. He previously underwent shoulder surgery and has been using frequent narcotic pain medication. He complains of acute worsening of abdominal cramping yesterday. The pain is generalized. It is associated with severe nausea and vomiting. He states he is unable to keep down any solids or liquids. He has been followed by his orthopedic doctor for constipation. He reports trying Epsom salt, stool softeners and over-the-counter laxatives. He states he is no longer taking his narcotic pain medications. He was seen by his orthopedic doctor today who instructed him to come to the emergency department for bowel structures and rule out. He is also complaining of diaphoresis. He states he is still passing gas. He has no other complaints today.  History reviewed. No pertinent past medical history. Past Surgical History  Procedure Laterality Date  . Shoulder arthroscopy w/ labral repair Right    No family history on file. Social History  Substance Use Topics  . Smoking status: Former Smoker -- 0.00 packs/day  . Smokeless tobacco: None  . Alcohol Use: Yes     Comment: ocassionally    Review of Systems   Gastrointestinal: Positive for nausea, vomiting, abdominal pain and constipation.  All other systems reviewed and are negative.     Allergies  Review of patient's allergies indicates no known allergies.  Home Medications   Prior to Admission medications   Medication Sig Start Date End Date Taking? Authorizing Provider  traMADol (ULTRAM) 50 MG tablet Take 1 tablet (50 mg total) by mouth every 6 (six) hours as needed. 04/19/15   Raeford Razor, MD   BP 146/111 mmHg  Pulse 86  Temp(Src) 98.3 F (36.8 C) (Oral)  Resp 18  Ht 6' (1.829 m)  Wt 83.915 kg  BMI 25.08 kg/m2  SpO2 100% Physical Exam  Constitutional: He appears well-developed and well-nourished. He appears distressed.  Diaphoretic and appears to be in moderate amount of pain  HENT:  Head: Normocephalic and atraumatic.  Eyes: Conjunctivae are normal. Right eye exhibits no discharge. Left eye exhibits no discharge. No scleral icterus.  Neck: Normal range of motion.  Cardiovascular: Normal rate, regular rhythm and normal heart sounds.   Pulmonary/Chest: Effort normal and breath sounds normal. No respiratory distress.  Abdominal: Soft. He exhibits no distension. There is tenderness. There is guarding. There is no rebound.  Generalized tenderness without distention. Voluntary guarding. Hyperactive bowel sounds.  Genitourinary:  Multiple nonthrombosed hemorrhoids on rectal exam. No hard stool in rectal vault.  Musculoskeletal: Normal range of motion.  Neurological: He is alert. Coordination normal.  Skin: Skin is warm. He is diaphoretic.  Psychiatric: He has a normal mood and affect. His behavior is normal.  Nursing note and  vitals reviewed.   ED Course  Procedures (including critical care time) Labs Review Labs Reviewed  COMPREHENSIVE METABOLIC PANEL - Abnormal; Notable for the following:    Potassium 3.1 (*)    Chloride 99 (*)    CO2 21 (*)    Glucose, Bld 147 (*)    Creatinine, Ser 1.32 (*)    Total Protein 8.3  (*)    Albumin 5.4 (*)    Anion gap 17 (*)    All other components within normal limits  CBC - Abnormal; Notable for the following:    WBC 12.7 (*)    Hemoglobin 17.5 (*)    MCHC 37.0 (*)    All other components within normal limits  LIPASE, BLOOD  URINALYSIS, ROUTINE W REFLEX MICROSCOPIC (NOT AT [facility])    Imaging Review Dg Abd Acute W/chest  06/20/2015  CLINICAL DATA:  Acute onset of generalized abdominal pain and nausea. Anal pain. No bowel movements in 11 days. Recent right shoulder surgery. Initial encounter. EXAM: DG ABDOMEN ACUTE W/ 1V CHEST COMPARISON:  Chest radiograph performed 06/01/2009 FINDINGS: The lungs are well-aerated and clear. There is no evidence of focal opacification, pleural effusion or pneumothorax. The cardiomediastinal silhouette is within normal limits. The visualized bowel gas pattern is unremarkable. Scattered stool and air are seen within the colon; there is no evidence of small bowel dilatation to suggest obstruction. No free intra-abdominal air is identified on the provided upright view. No acute osseous abnormalities are seen; the sacroiliac joints are unremarkable in appearance. IMPRESSION: 1. Unremarkable bowel gas pattern; no free intra-abdominal air seen. Large amount of stool noted in the colon, concerning for constipation. 2. No acute cardiopulmonary process seen. Electronically Signed   By: Roanna Raider M.D.   On: 06/20/2015 23:11   I have personally reviewed and evaluated these images and lab results as part of my medical decision-making.   EKG Interpretation None      MDM   Final diagnoses:  Constipation, unspecified constipation type   35 year old male presenting with 10+ days of constipation with acute worsening of abdominal cramping, nausea and vomiting yesterday. Failed outpatient treatments with Epsom salt, stool softeners and over-the-counter laxatives. Seen by orthopedic doctor today and instructed to come to emergency department for bowel  obstruction rule out. Patient is diaphoretic and appears in a moderate amount of distress. Generalized tenderness over the abdomen with voluntary guarding. Hyperactive bowel sounds. Multiple nonthrombosed hemorrhoids noted on rectal exam. No hard stool palpated in the rectal vault. White blood cell count elevated to 12.7. Potassium 3.1 and creatinine elevated at 1.32. Patient likely dehydrated. Potassium repleted in emergency department. Acute abdomen series shows large amount of stool consistent with constipation without bowel obstruction or free air. Given IV Zofran and 1 L bolus. Attempted Fleet enema without relief. Nausea returned and treated with Reglan. Given Dulcolax and magnesium citrate. Patient reports significant relief and large bowel movement afterwards. He reports full resolution of his symptoms and is ready to go home. He is tolerating PO. Patient is no longer diaphoretic and in no acute distress. Discussed using MiraLAX if patient's constipation returns. Patient is to follow-up with his primary care provider if symptoms return. Return precautions given in discharge paperwork and discussed with pt at bedside. Pt stable for discharge     Alveta Heimlich, PA-C 06/21/15 0220  Paula Libra, MD 06/21/15 [phone]

## 2015-06-21 MED ORDER — SODIUM CHLORIDE 0.9 % IV BOLUS (SEPSIS)
1000.0000 mL | Freq: Once | INTRAVENOUS | Status: AC
  Administered 2015-06-21: 1000 mL via INTRAVENOUS

## 2015-06-21 MED ORDER — METOCLOPRAMIDE HCL 5 MG/ML IJ SOLN
10.0000 mg | Freq: Once | INTRAMUSCULAR | Status: AC
  Administered 2015-06-21: 10 mg via INTRAVENOUS
  Filled 2015-06-21: qty 2

## 2015-06-21 MED ORDER — MAGNESIUM CITRATE PO SOLN
1.0000 | Freq: Once | ORAL | Status: AC
  Administered 2015-06-21: 1 via ORAL
  Filled 2015-06-21: qty 296

## 2015-06-21 MED ORDER — BISACODYL 10 MG RE SUPP: 10.0000 mg | Freq: Once | RECTAL | Status: DC

## 2015-06-21 NOTE — Discharge Instructions (Signed)
Take 6 capfuls of miralax in a 32 oz gatorade and drink the whole beverage followed by 3 capfuls twice a day if you become constipated again. Follow-up with your primary care provider in the next week. Increase the fiber in your diet. Do not take any more narcotic pain medications. Return to the emergency department with any new, worsening or concerning symptoms.   Constipation, Adult Constipation is when a person has fewer than three bowel movements a week, has difficulty having a bowel movement, or has stools that are dry, hard, or larger than normal. As people grow older, constipation is more common. A low-fiber diet, not taking in enough fluids, and taking certain medicines may make constipation worse.  CAUSES   Certain medicines, such as antidepressants, pain medicine, iron supplements, antacids, and water pills.   Certain diseases, such as diabetes, irritable bowel syndrome (IBS), thyroid disease, or depression.   Not drinking enough water.   Not eating enough fiber-rich foods.   Stress or travel.   Lack of physical activity or exercise.   Ignoring the urge to have a bowel movement.   Using laxatives too much.  SIGNS AND SYMPTOMS   Having fewer than three bowel movements a week.   Straining to have a bowel movement.   Having stools that are hard, dry, or larger than normal.   Feeling full or bloated.   Pain in the lower abdomen.   Not feeling relief after having a bowel movement.  DIAGNOSIS  Your health care provider will take a medical history and perform a physical exam. Further testing may be done for severe constipation. Some tests may include:  A barium enema X-ray to examine your rectum, colon, and, sometimes, your small intestine.   A sigmoidoscopy to examine your lower colon.   A colonoscopy to examine your entire colon. TREATMENT  Treatment will depend on the severity of your constipation and what is causing it. Some dietary treatments  include drinking more fluids and eating more fiber-rich foods. Lifestyle treatments may include regular exercise. If these diet and lifestyle recommendations do not help, your health care provider may recommend taking over-the-counter laxative medicines to help you have bowel movements. Prescription medicines may be prescribed if over-the-counter medicines do not work.  HOME CARE INSTRUCTIONS   Eat foods that have a lot of fiber, such as fruits, vegetables, whole grains, and beans.  Limit foods high in fat and processed sugars, such as french fries, hamburgers, cookies, candies, and soda.   A fiber supplement may be added to your diet if you cannot get enough fiber from foods.   Drink enough fluids to keep your urine clear or pale yellow.   Exercise regularly or as directed by your health care provider.   Go to the restroom when you have the urge to go. Do not hold it.   Only take over-the-counter or prescription medicines as directed by your health care provider. Do not take other medicines for constipation without talking to your health care provider first.  SEEK IMMEDIATE MEDICAL CARE IF:   You have bright red blood in your stool.   Your constipation lasts for more than 4 days or gets worse.   You have abdominal or rectal pain.   You have thin, pencil-like stools.   You have unexplained weight loss. MAKE SURE YOU:   Understand these instructions.  Will watch your condition.  Will get help right away if you are not doing well or get worse.   This information is  not intended to replace advice given to you by your health care provider. Make sure you discuss any questions you have with your health care provider.   Document Released: 01/06/2004 Document Revised: 04/30/2014 Document Reviewed: 01/19/2013 [employer] Interactive Patient Education [employer]

## 2016-08-23 ENCOUNTER — Ambulatory Visit: Payer: BLUE CROSS/BLUE SHIELD | Admitting: Family Medicine

## 2017-04-10 IMAGING — DX DG SHOULDER 2+V PORT*R*
2 series · 2 of 2 positions shown · non-contrast
Comparison: Same day.

CLINICAL DATA: Status post reduction of dislocation.

EXAM:
PORTABLE RIGHT SHOULDER - 2+ VIEW

[shoulder ap]
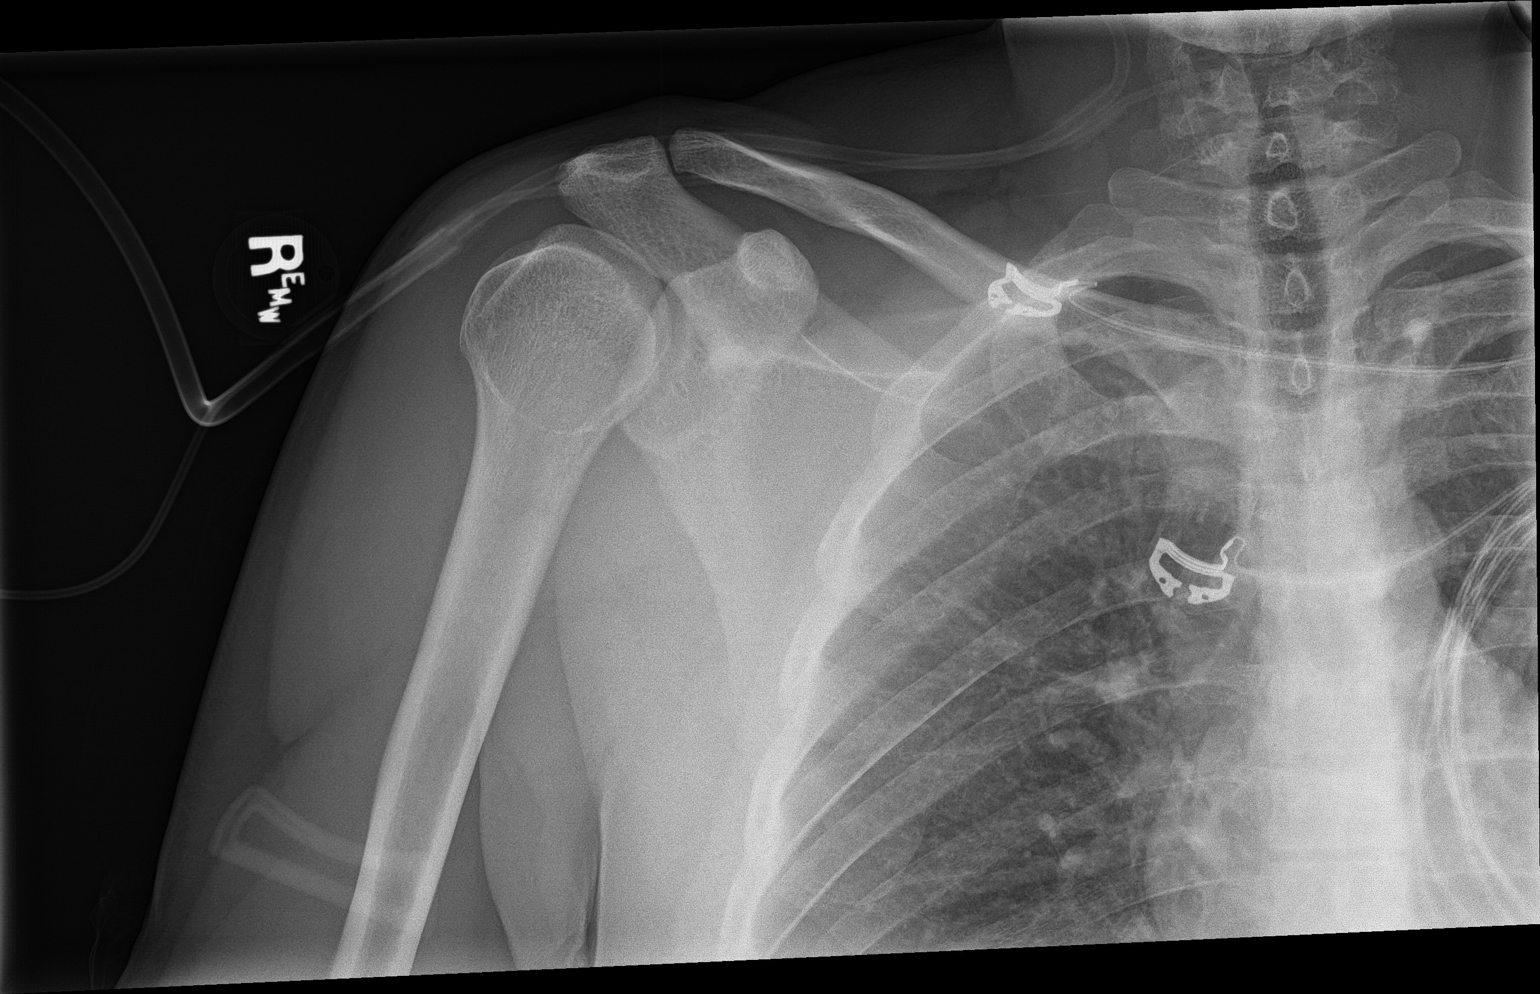

[shoulder y-view]
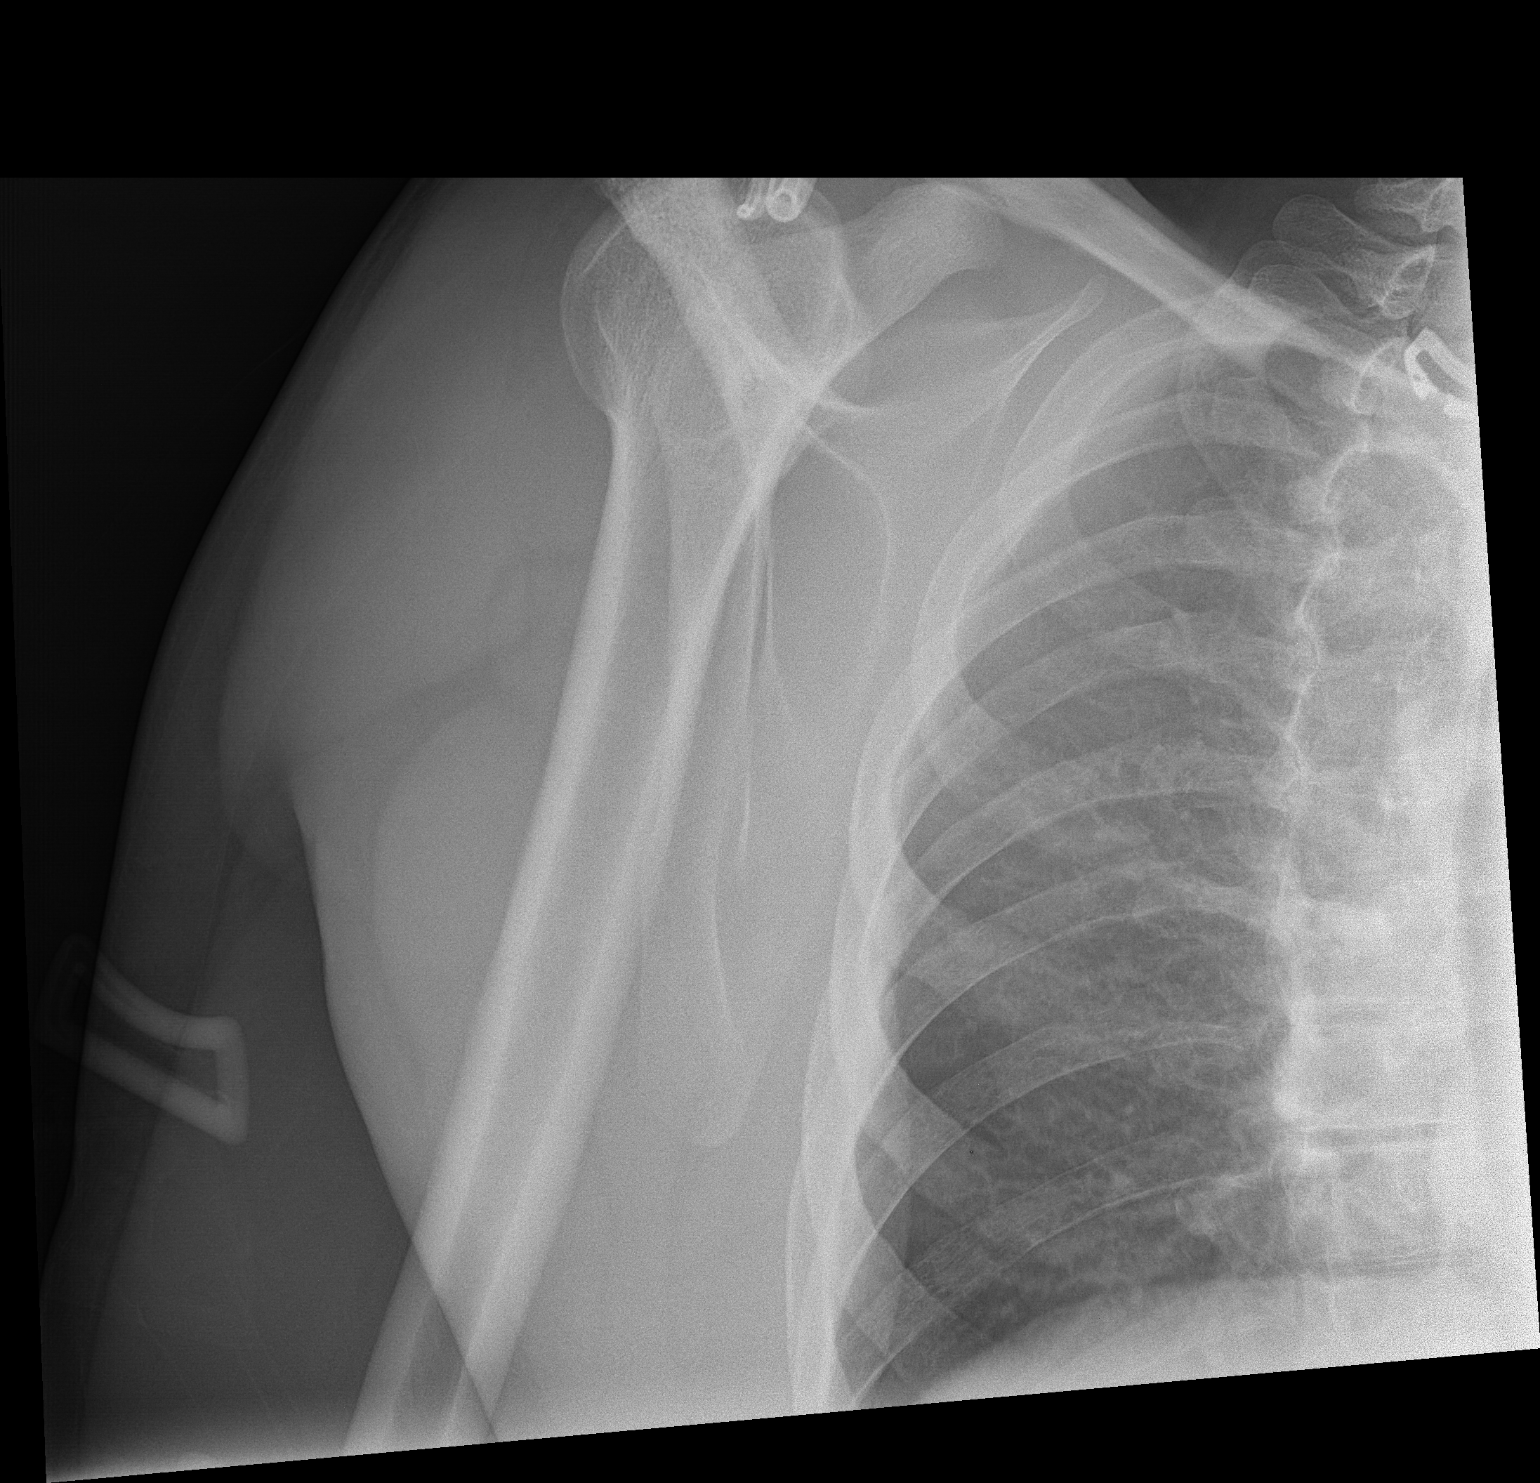

[2 of 2 positions shown; findings below may reference images not displayed]

FINDINGS: Previously described anterior dislocation has been successfully
reduced. No fracture is noted.
IMPRESSION: Successful reduction of previously described right anterior
dislocation.
